# Patient Record
Sex: Male | Born: 1955 | Race: White | Hispanic: No | Marital: Married
Health system: Southern US, Community
[De-identification: ages and names within clinical notes are randomized; demographics above are authoritative.]

## PROBLEM LIST (undated history)

## (undated) DIAGNOSIS — E1169 Type 2 diabetes mellitus with other specified complication: Secondary | ICD-10-CM

## (undated) DIAGNOSIS — I10 Essential (primary) hypertension: Secondary | ICD-10-CM

## (undated) HISTORY — DX: Type 2 diabetes mellitus with other specified complication: E11.69

## (undated) HISTORY — DX: Morbid (severe) obesity due to excess calories: E66.01

## (undated) HISTORY — DX: Essential (primary) hypertension: I10

---

## 2002-07-06 ENCOUNTER — Encounter: Payer: Self-pay | Admitting: Family Medicine

## 2002-07-06 LAB — CONVERTED CEMR LAB: Microalbumin U total vol: 9.8 mg/L

## 2005-02-04 ENCOUNTER — Encounter: Payer: Self-pay | Admitting: Family Medicine

## 2005-02-04 LAB — CONVERTED CEMR LAB: Hgb A1c MFr Bld: 9.7 %

## 2005-03-05 ENCOUNTER — Other Ambulatory Visit: Payer: Self-pay

## 2005-03-13 ENCOUNTER — Ambulatory Visit: Payer: Self-pay | Admitting: Unknown Physician Specialty

## 2006-01-04 ENCOUNTER — Encounter: Payer: Self-pay | Admitting: Family Medicine

## 2006-01-26 ENCOUNTER — Ambulatory Visit: Payer: Self-pay | Admitting: Family Medicine

## 2006-06-05 ENCOUNTER — Encounter: Payer: Self-pay | Admitting: Family Medicine

## 2006-06-23 ENCOUNTER — Ambulatory Visit: Payer: Self-pay | Admitting: Family Medicine

## 2006-09-01 ENCOUNTER — Telehealth: Payer: Self-pay | Admitting: Family Medicine

## 2006-09-09 ENCOUNTER — Telehealth (INDEPENDENT_AMBULATORY_CARE_PROVIDER_SITE_OTHER): Payer: Self-pay | Admitting: *Deleted

## 2006-11-01 ENCOUNTER — Encounter: Payer: Self-pay | Admitting: Family Medicine

## 2006-11-01 DIAGNOSIS — E119 Type 2 diabetes mellitus without complications: Secondary | ICD-10-CM

## 2006-11-01 DIAGNOSIS — E669 Obesity, unspecified: Secondary | ICD-10-CM | POA: Insufficient documentation

## 2006-11-17 ENCOUNTER — Ambulatory Visit: Payer: Self-pay | Admitting: Family Medicine

## 2006-11-17 DIAGNOSIS — F341 Dysthymic disorder: Secondary | ICD-10-CM | POA: Insufficient documentation

## 2006-11-17 DIAGNOSIS — M25569 Pain in unspecified knee: Secondary | ICD-10-CM

## 2006-12-02 ENCOUNTER — Encounter: Payer: Self-pay | Admitting: Family Medicine

## 2006-12-04 ENCOUNTER — Encounter: Payer: Self-pay | Admitting: Family Medicine

## 2012-08-18 ENCOUNTER — Ambulatory Visit: Payer: Self-pay | Admitting: Internal Medicine

## 2016-08-07 ENCOUNTER — Encounter: Admission: RE | Payer: Self-pay | Source: Ambulatory Visit

## 2016-08-07 ENCOUNTER — Ambulatory Visit: Admission: RE | Admit: 2016-08-07 | Payer: 59 | Source: Ambulatory Visit | Admitting: Gastroenterology

## 2016-08-07 SURGERY — COLONOSCOPY WITH PROPOFOL
Anesthesia: General

## 2017-05-31 DIAGNOSIS — R7989 Other specified abnormal findings of blood chemistry: Secondary | ICD-10-CM | POA: Diagnosis not present

## 2017-05-31 DIAGNOSIS — Z794 Long term (current) use of insulin: Secondary | ICD-10-CM | POA: Diagnosis not present

## 2017-05-31 DIAGNOSIS — E119 Type 2 diabetes mellitus without complications: Secondary | ICD-10-CM | POA: Diagnosis not present

## 2017-06-01 DIAGNOSIS — E7800 Pure hypercholesterolemia, unspecified: Secondary | ICD-10-CM | POA: Diagnosis not present

## 2017-06-01 DIAGNOSIS — I10 Essential (primary) hypertension: Secondary | ICD-10-CM | POA: Diagnosis not present

## 2017-06-01 DIAGNOSIS — E119 Type 2 diabetes mellitus without complications: Secondary | ICD-10-CM | POA: Diagnosis not present

## 2017-08-27 DIAGNOSIS — E119 Type 2 diabetes mellitus without complications: Secondary | ICD-10-CM | POA: Diagnosis not present

## 2017-08-27 DIAGNOSIS — Z794 Long term (current) use of insulin: Secondary | ICD-10-CM | POA: Diagnosis not present

## 2017-08-31 DIAGNOSIS — E119 Type 2 diabetes mellitus without complications: Secondary | ICD-10-CM | POA: Diagnosis not present

## 2017-08-31 DIAGNOSIS — E7800 Pure hypercholesterolemia, unspecified: Secondary | ICD-10-CM | POA: Diagnosis not present

## 2017-08-31 DIAGNOSIS — I10 Essential (primary) hypertension: Secondary | ICD-10-CM | POA: Diagnosis not present

## 2017-09-23 DIAGNOSIS — R7989 Other specified abnormal findings of blood chemistry: Secondary | ICD-10-CM | POA: Diagnosis not present

## 2017-12-08 DIAGNOSIS — I10 Essential (primary) hypertension: Secondary | ICD-10-CM | POA: Diagnosis not present

## 2017-12-08 DIAGNOSIS — E7800 Pure hypercholesterolemia, unspecified: Secondary | ICD-10-CM | POA: Diagnosis not present

## 2017-12-08 DIAGNOSIS — E119 Type 2 diabetes mellitus without complications: Secondary | ICD-10-CM | POA: Diagnosis not present

## 2018-01-12 DIAGNOSIS — E119 Type 2 diabetes mellitus without complications: Secondary | ICD-10-CM | POA: Diagnosis not present

## 2018-01-12 DIAGNOSIS — Z794 Long term (current) use of insulin: Secondary | ICD-10-CM | POA: Diagnosis not present

## 2018-01-12 DIAGNOSIS — I10 Essential (primary) hypertension: Secondary | ICD-10-CM | POA: Diagnosis not present

## 2018-01-20 DIAGNOSIS — E119 Type 2 diabetes mellitus without complications: Secondary | ICD-10-CM | POA: Diagnosis not present

## 2018-01-20 DIAGNOSIS — I10 Essential (primary) hypertension: Secondary | ICD-10-CM | POA: Diagnosis not present

## 2018-01-20 DIAGNOSIS — E7800 Pure hypercholesterolemia, unspecified: Secondary | ICD-10-CM | POA: Diagnosis not present

## 2019-06-03 ENCOUNTER — Ambulatory Visit (HOSPITAL_COMMUNITY)
Admission: RE | Admit: 2019-06-03 | Discharge: 2019-06-03 | Disposition: A | Discharge status: Home / Self Care | Payer: 59 | Source: Ambulatory Visit | Attending: Pulmonary Disease | Admitting: Pulmonary Disease

## 2019-06-03 ENCOUNTER — Telehealth: Payer: Self-pay | Admitting: Physician Assistant

## 2019-06-03 ENCOUNTER — Encounter: Payer: Self-pay | Admitting: Physician Assistant

## 2019-06-03 ENCOUNTER — Other Ambulatory Visit: Payer: Self-pay | Admitting: Physician Assistant

## 2019-06-03 DIAGNOSIS — E669 Obesity, unspecified: Secondary | ICD-10-CM | POA: Insufficient documentation

## 2019-06-03 DIAGNOSIS — E119 Type 2 diabetes mellitus without complications: Secondary | ICD-10-CM

## 2019-06-03 DIAGNOSIS — I10 Essential (primary) hypertension: Secondary | ICD-10-CM | POA: Insufficient documentation

## 2019-06-03 DIAGNOSIS — E1169 Type 2 diabetes mellitus with other specified complication: Secondary | ICD-10-CM | POA: Insufficient documentation

## 2019-06-03 DIAGNOSIS — U071 COVID-19: Secondary | ICD-10-CM

## 2019-06-03 MED ORDER — EPINEPHRINE 0.3 MG/0.3ML IJ SOAJ: 0.3000 mg | Freq: Once | INTRAMUSCULAR | Status: DC | PRN

## 2019-06-03 MED ORDER — DIPHENHYDRAMINE HCL 50 MG/ML IJ SOLN: 50.0000 mg | Freq: Once | INTRAMUSCULAR | Status: DC | PRN

## 2019-06-03 MED ORDER — SODIUM CHLORIDE 0.9 % IV SOLN
700.0000 mg | Freq: Once | INTRAVENOUS | Status: AC
  Administered 2019-06-03: 700 mg via INTRAVENOUS
  Filled 2019-06-03: qty 20

## 2019-06-03 MED ORDER — FAMOTIDINE IN NACL 20-0.9 MG/50ML-% IV SOLN: 20.0000 mg | Freq: Once | INTRAVENOUS | Status: DC | PRN

## 2019-06-03 MED ORDER — ALBUTEROL SULFATE HFA 108 (90 BASE) MCG/ACT IN AERS: 2.0000 | INHALATION_SPRAY | Freq: Once | RESPIRATORY_TRACT | Status: DC | PRN

## 2019-06-03 MED ORDER — SODIUM CHLORIDE 0.9 % IV SOLN
INTRAVENOUS | Status: DC | PRN
  Administered 2019-06-03: 250 mL via INTRAVENOUS

## 2019-06-03 MED ORDER — METHYLPREDNISOLONE SODIUM SUCC 125 MG IJ SOLR: 125.0000 mg | Freq: Once | INTRAMUSCULAR | Status: DC | PRN

## 2019-06-03 NOTE — Discharge Instructions (Signed)

## 2019-06-03 NOTE — Telephone Encounter (Signed)
  I connected by phone with Roy Walton on 06/03/2019 at 8:43 AM to discuss the potential use of an new treatment for mild to moderate COVID-19 viral infection in non-hospitalized patients.  This patient is a 64 y.o. male that meets the FDA criteria for Emergency Use Authorization of bamlanivimab or casirivimab\imdevimab.  Has a (+) direct SARS-CoV-2 viral test result  Has mild or moderate COVID-19   Is ? 63 years of age and weighs ? 40 kg  Is NOT hospitalized due to COVID-19  Is NOT requiring oxygen therapy or requiring an increase in baseline oxygen flow rate due to COVID-19  Is within 10 days of symptom onset  Has at least one of the high risk factor(s) for progression to severe COVID-19 and/or hospitalization as defined in EUA.  Specific high risk criteria : Hypertension and DMT2, BMI 365   I have spoken and communicated the following to the patient or parent/caregiver:  1. FDA has authorized the emergency use of bamlanivimab and casirivimab\imdevimab for the treatment of mild to moderate COVID-19 in adults and pediatric patients with positive results of direct SARS-CoV-2 viral testing who are 31 years of age and older weighing at least 40 kg, and who are at high risk for progressing to severe COVID-19 and/or hospitalization.  2. The significant known and potential risks and benefits of bamlanivimab and casirivimab\imdevimab, and the extent to which such potential risks and benefits are unknown.  3. Information on available alternative treatments and the risks and benefits of those alternatives, including clinical trials.  4. Patients treated with bamlanivimab and casirivimab\imdevimab should continue to self-isolate and use infection control measures (e.g., wear mask, isolate, social distance, avoid sharing personal items, clean and disinfect "high touch" surfaces, and frequent handwashing) according to CDC guidelines.   5. The patient or parent/caregiver has the option to  accept or refuse bamlanivimab or casirivimab\imdevimab .  After reviewing this information with the patient, The patient agreed to proceed with receiving the bamlanimivab infusion and will be provided a copy of the Fact sheet prior to receiving the infusion.   Sx onset 05/30/19. Set up for infusion today at 10:30 am. Directions given    Cline Crock PA-C 06/03/2019 8:43 AM .

## 2019-06-05 NOTE — Telephone Encounter (Signed)
Have not seen pt since 2008. Pt goes to [place].

## 2019-06-05 NOTE — Telephone Encounter (Signed)
Noted  

## 2019-06-21 ENCOUNTER — Other Ambulatory Visit (HOSPITAL_COMMUNITY): Payer: Self-pay | Admitting: Psychiatry

## 2019-06-21 ENCOUNTER — Other Ambulatory Visit: Payer: Self-pay

## 2019-06-21 ENCOUNTER — Other Ambulatory Visit (HOSPITAL_COMMUNITY): Payer: Self-pay | Admitting: Internal Medicine

## 2019-06-21 ENCOUNTER — Other Ambulatory Visit: Payer: Self-pay | Admitting: Internal Medicine

## 2019-06-21 ENCOUNTER — Ambulatory Visit
Admission: RE | Admit: 2019-06-21 | Discharge: 2019-06-21 | Disposition: A | Discharge status: Home / Self Care | Payer: 59 | Source: Ambulatory Visit | Attending: Internal Medicine | Admitting: Internal Medicine

## 2019-06-21 DIAGNOSIS — M79661 Pain in right lower leg: Secondary | ICD-10-CM | POA: Insufficient documentation

## 2020-03-18 IMAGING — US US EXTREM LOW VENOUS*R*
1 series · 14 of 24 positions shown · non-contrast
Comparison: None

CLINICAL DATA: RIGHT calf tenderness, pain, and swelling

EXAM:
RIGHT LOWER EXTREMITY VENOUS DOPPLER ULTRASOUND
TECHNIQUE: Gray-scale sonography with compression, as well as color and duplex
ultrasound, were performed to evaluate the deep venous system(s)
from the level of the common femoral vein through the popliteal and
proximal calf veins.

[Series 1: us extrem low venous*right* · 0.09mm/px · 14 of 34 slices shown]
[im 1/34]
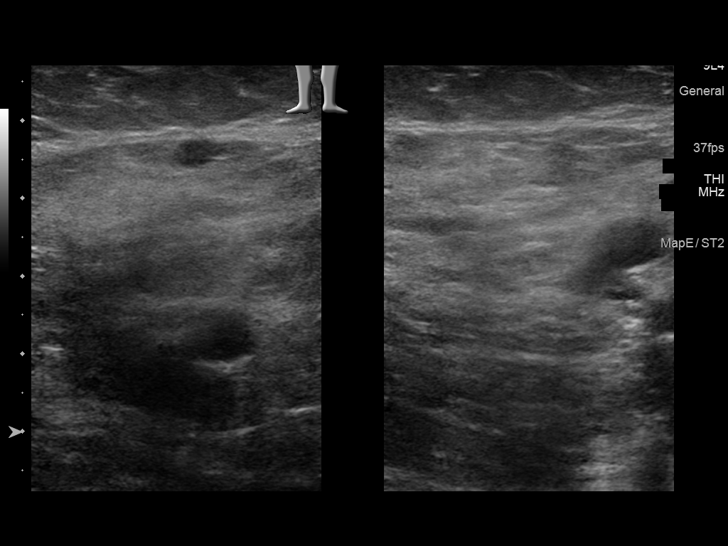
[im 3/34]
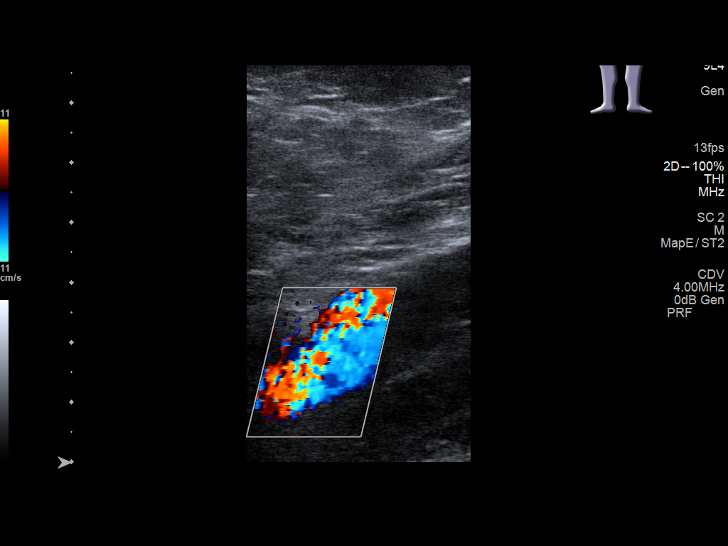
[im 6/34]
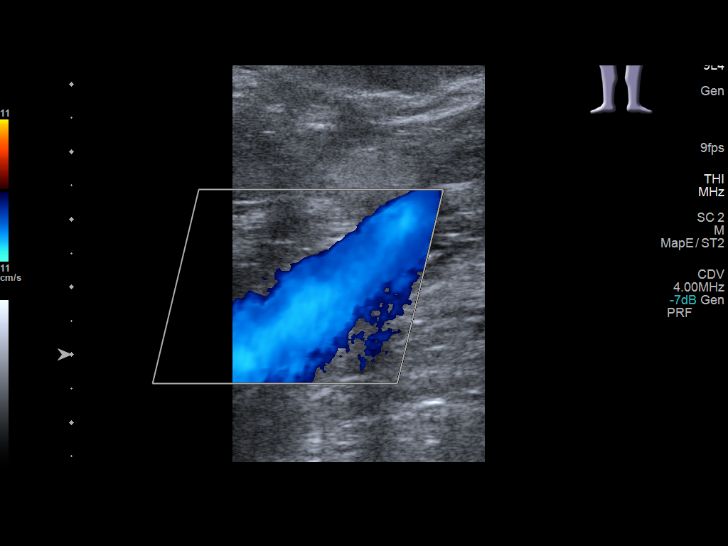
[im 9/34]
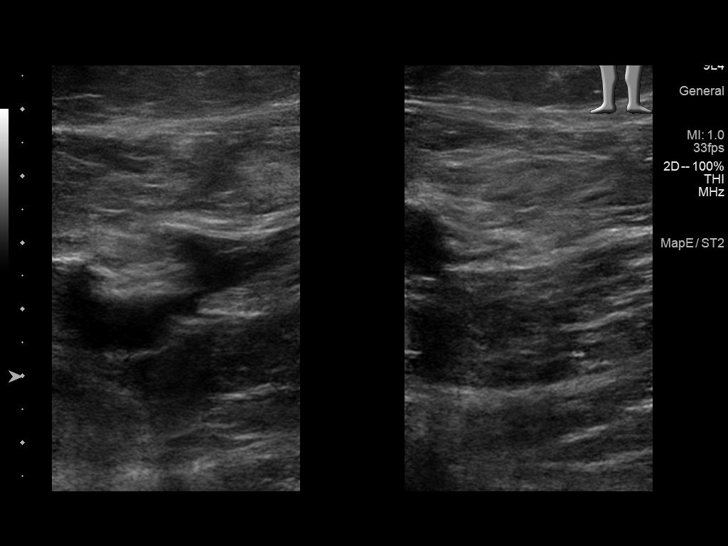
[im 11/34]
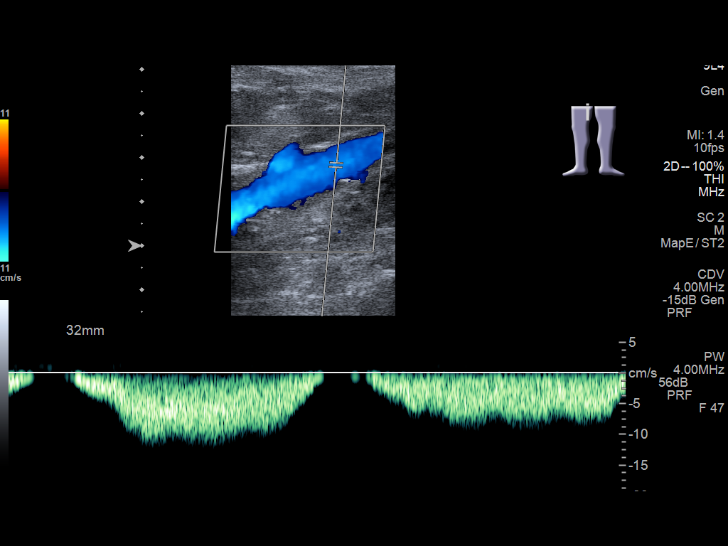
[im 13/34]
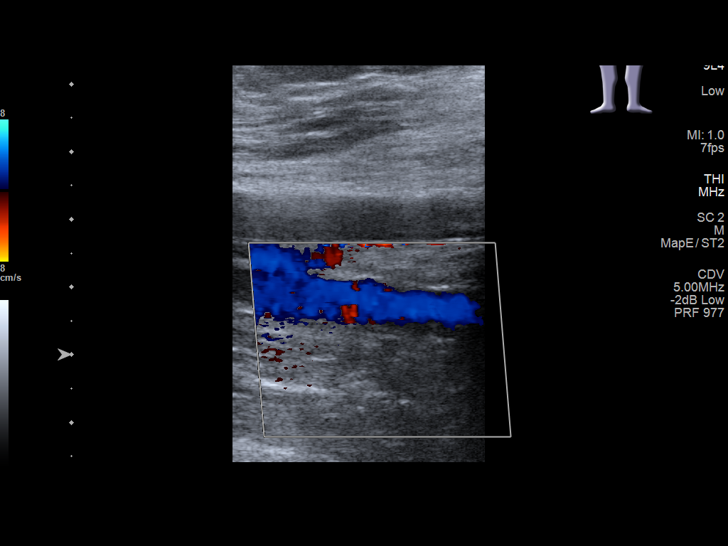
[im 16/34]
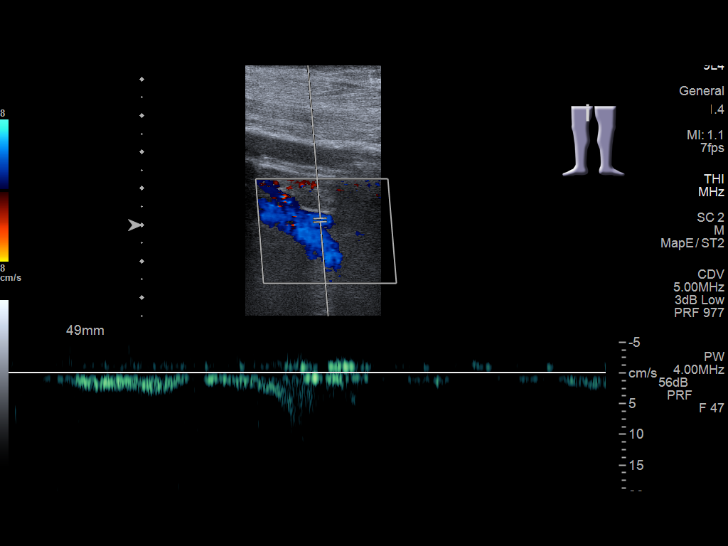
[im 18/34]
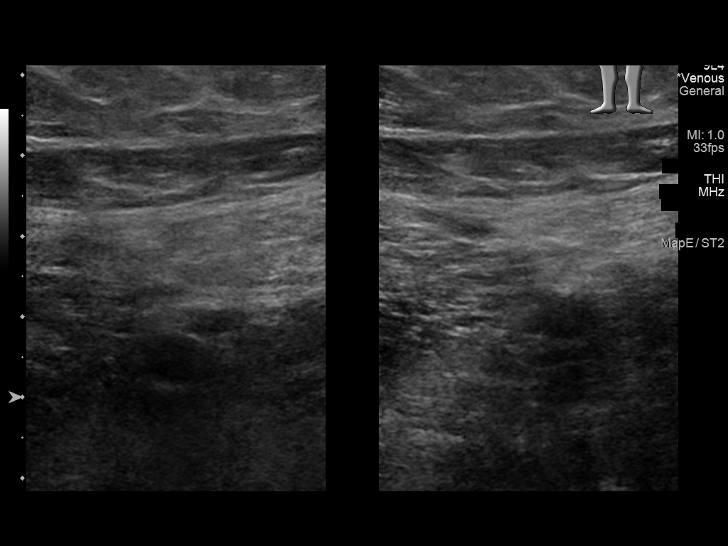
[im 21/34]
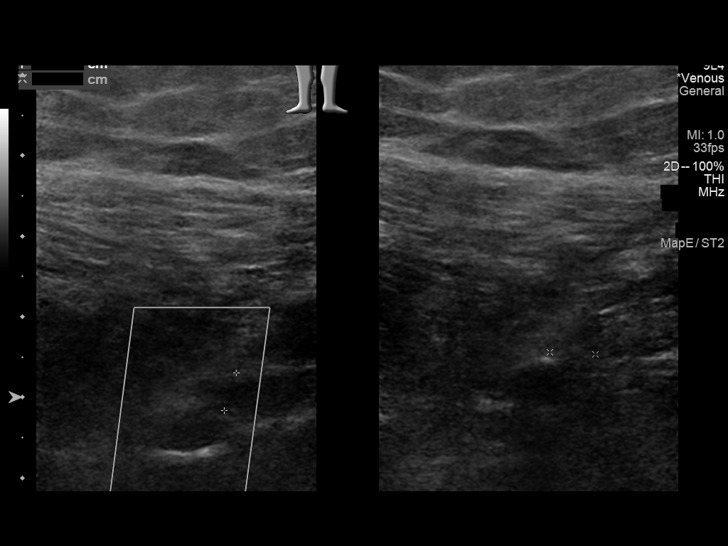
[im 23/34]
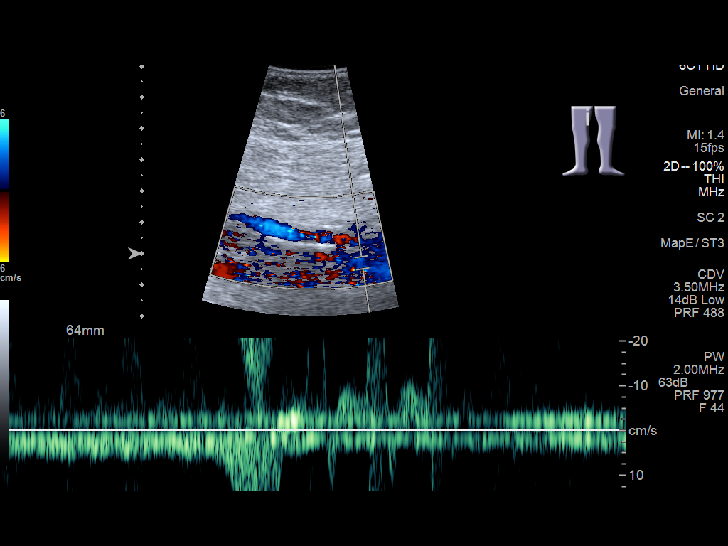
[im 26/34]
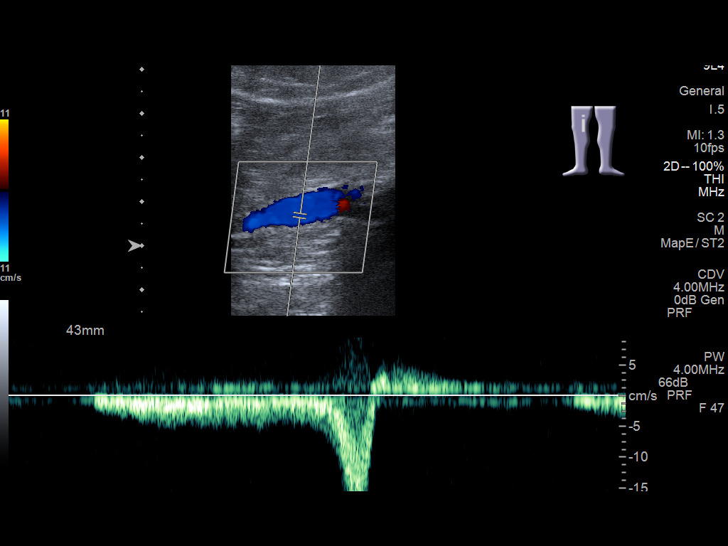
[im 28/34]
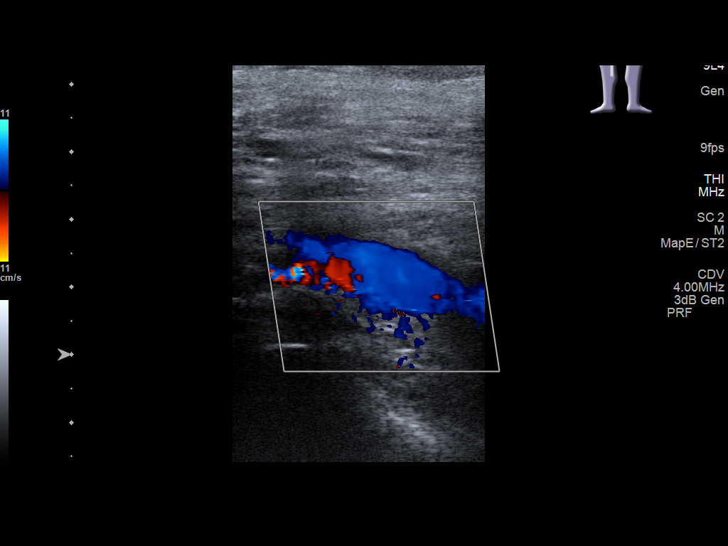
[im 31/34]
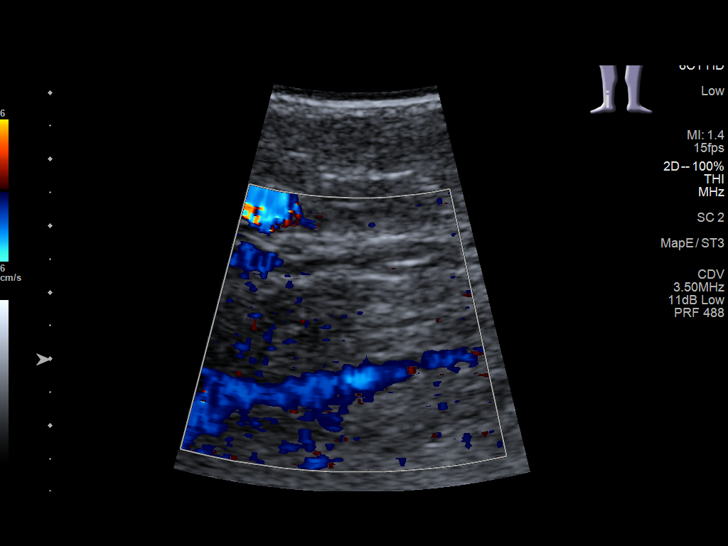
[im 34/34]
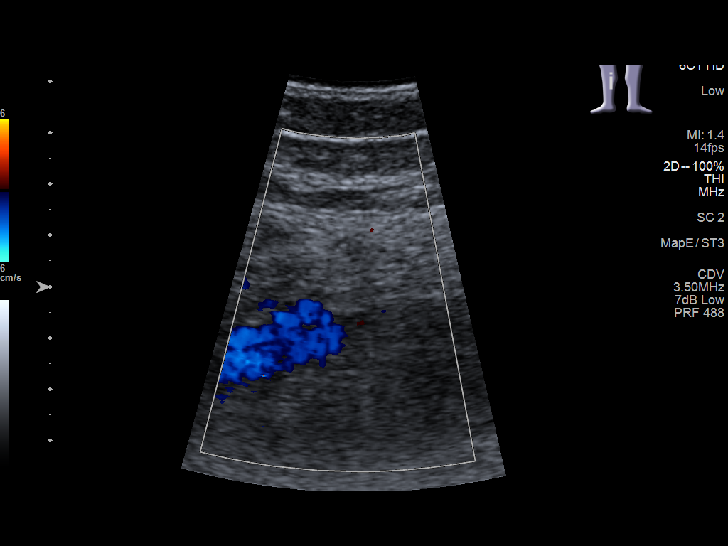

[14 of 24 positions shown; findings below may reference images not displayed]

FINDINGS: VENOUS

Normal compressibility of the common femoral, superficial femoral,
and popliteal veins. Limited visualization of calf veins. Visualized
portions of profunda femoral vein and great saphenous vein
unremarkable. No filling defects to suggest DVT on grayscale or
color Doppler imaging. Doppler waveforms show normal direction of
venous flow, normal respiratory phasicity and response to
augmentation.

Limited views of the contralateral common femoral vein are
unremarkable.

OTHER

None.

Limitations: none
IMPRESSION: No evidence of deep venous thrombosis in the LEFT lower extremity as
above.

If clinical symptoms are inconsistent or if there are persistent or
worsening symptoms, further imaging (possibly involving the iliac
veins) may be warranted.
